# Patient Record
Sex: Female | Born: 1955 | Race: White | Hispanic: No | Marital: Married | Smoking: Never smoker
Health system: Southern US, Community
[De-identification: ages and names within clinical notes are randomized; demographics above are authoritative.]

## PROBLEM LIST (undated history)

## (undated) DIAGNOSIS — Z789 Other specified health status: Secondary | ICD-10-CM

## (undated) DIAGNOSIS — T8859XA Other complications of anesthesia, initial encounter: Secondary | ICD-10-CM

## (undated) DIAGNOSIS — Z9889 Other specified postprocedural states: Secondary | ICD-10-CM

## (undated) DIAGNOSIS — M1990 Unspecified osteoarthritis, unspecified site: Secondary | ICD-10-CM

## (undated) DIAGNOSIS — R112 Nausea with vomiting, unspecified: Secondary | ICD-10-CM

## (undated) DIAGNOSIS — T4145XA Adverse effect of unspecified anesthetic, initial encounter: Secondary | ICD-10-CM

## (undated) DIAGNOSIS — E039 Hypothyroidism, unspecified: Secondary | ICD-10-CM

## (undated) HISTORY — PX: COLONOSCOPY: SHX174

## (undated) HISTORY — PX: KNEE ARTHROSCOPY: SHX127

---

## 2008-07-08 ENCOUNTER — Encounter: Admission: RE | Admit: 2008-07-08 | Discharge: 2008-10-06 | Payer: Self-pay | Admitting: Orthopedic Surgery

## 2009-09-29 ENCOUNTER — Inpatient Hospital Stay (HOSPITAL_COMMUNITY): Admission: RE | Admit: 2009-09-29 | Discharge: 2009-10-01 | Payer: Self-pay | Admitting: Orthopedic Surgery

## 2009-09-29 HISTORY — PX: TOTAL KNEE ARTHROPLASTY: SHX125

## 2009-10-29 ENCOUNTER — Encounter: Admission: RE | Admit: 2009-10-29 | Discharge: 2009-10-29 | Payer: Self-pay | Admitting: Neurosurgery

## 2009-11-13 ENCOUNTER — Encounter
Admission: RE | Admit: 2009-11-13 | Discharge: 2010-01-27 | Payer: Self-pay | Source: Home / Self Care | Attending: Orthopedic Surgery | Admitting: Orthopedic Surgery

## 2010-04-17 LAB — CROSSMATCH

## 2010-04-17 LAB — CBC
Hemoglobin: 8.6 g/dL — ABNORMAL LOW (ref 12.0–15.0)
Hemoglobin: 9.5 g/dL — ABNORMAL LOW (ref 12.0–15.0)
MCHC: 33 g/dL (ref 30.0–36.0)
MCHC: 33.6 g/dL (ref 30.0–36.0)
MCHC: 34 g/dL (ref 30.0–36.0)
MCV: 92.2 fL (ref 78.0–100.0)
MCV: 94.5 fL (ref 78.0–100.0)
MCV: 95.3 fL (ref 78.0–100.0)
Platelets: 227 10*3/uL (ref 150–400)
RBC: 3.07 MIL/uL — ABNORMAL LOW (ref 3.87–5.11)
RDW: 12.8 % (ref 11.5–15.5)
WBC: 11.5 10*3/uL — ABNORMAL HIGH (ref 4.0–10.5)
WBC: 8.3 10*3/uL (ref 4.0–10.5)

## 2010-04-17 LAB — BASIC METABOLIC PANEL
Calcium: 8.5 mg/dL (ref 8.4–10.5)
Chloride: 106 mEq/L (ref 96–112)
Creatinine, Ser: 0.81 mg/dL (ref 0.4–1.2)
GFR calc non Af Amer: >60 mL/min (ref >60)
Glucose, Bld: 108 mg/dL — ABNORMAL HIGH (ref 70–99)
Potassium: 3.3 mEq/L — ABNORMAL LOW (ref 3.5–5.1)
Sodium: 139 mEq/L (ref 135–145)

## 2010-04-17 LAB — COMPREHENSIVE METABOLIC PANEL
ALT: 25 U/L (ref 0–35)
AST: 25 U/L (ref 0–37)
Albumin: 4.6 g/dL (ref 3.5–5.2)
Alkaline Phosphatase: 48 U/L (ref 39–117)
CO2: 29 mEq/L (ref 19–32)
GFR calc Af Amer: >60 mL/min (ref >60)
GFR calc non Af Amer: >60 mL/min (ref >60)
Glucose, Bld: 104 mg/dL — ABNORMAL HIGH (ref 70–99)
Potassium: 4 mEq/L (ref 3.5–5.1)
Sodium: 138 mEq/L (ref 135–145)
Total Bilirubin: 0.9 mg/dL (ref 0.3–1.2)
Total Protein: 7.1 g/dL (ref 6.0–8.3)

## 2010-04-17 LAB — URINALYSIS, ROUTINE W REFLEX MICROSCOPIC
Glucose, UA: NEGATIVE mg/dL
Hgb urine dipstick: NEGATIVE
Nitrite: NEGATIVE
pH: 6.5 (ref 5.0–8.0)

## 2010-04-17 LAB — DIFFERENTIAL
Basophils Absolute: 0 10*3/uL (ref 0.0–0.1)
Lymphocytes Relative: 39 % (ref 12–46)
Monocytes Relative: 7 % (ref 3–12)
Neutrophils Relative %: 52 % (ref 43–77)

## 2010-04-17 LAB — SURGICAL PCR SCREEN
MRSA, PCR: NEGATIVE
Staphylococcus aureus: NEGATIVE

## 2012-02-02 HISTORY — PX: LUMBAR FUSION: SHX111

## 2013-10-01 ENCOUNTER — Encounter (HOSPITAL_BASED_OUTPATIENT_CLINIC_OR_DEPARTMENT_OTHER): Payer: Self-pay | Admitting: *Deleted

## 2013-10-01 NOTE — Progress Notes (Signed)
No labs Pt insists she is doing this under local-told her to be here 1215-husband will come wih her

## 2013-10-02 ENCOUNTER — Other Ambulatory Visit: Payer: Self-pay | Admitting: Orthopedic Surgery

## 2013-10-04 ENCOUNTER — Encounter (HOSPITAL_BASED_OUTPATIENT_CLINIC_OR_DEPARTMENT_OTHER)
Admission: RE | Disposition: A | Discharge status: Home / Self Care | Payer: Self-pay | Source: Ambulatory Visit | Attending: Orthopedic Surgery

## 2013-10-04 ENCOUNTER — Encounter (HOSPITAL_BASED_OUTPATIENT_CLINIC_OR_DEPARTMENT_OTHER): Payer: Self-pay | Admitting: Anesthesiology

## 2013-10-04 ENCOUNTER — Ambulatory Visit (HOSPITAL_BASED_OUTPATIENT_CLINIC_OR_DEPARTMENT_OTHER)
Admission: RE | Admit: 2013-10-04 | Discharge: 2013-10-04 | Disposition: A | Discharge status: Home / Self Care | Payer: BC Managed Care – PPO | Source: Ambulatory Visit | Attending: Orthopedic Surgery | Admitting: Orthopedic Surgery

## 2013-10-04 ENCOUNTER — Encounter (HOSPITAL_BASED_OUTPATIENT_CLINIC_OR_DEPARTMENT_OTHER): Payer: Self-pay | Admitting: Orthopedic Surgery

## 2013-10-04 DIAGNOSIS — M19049 Primary osteoarthritis, unspecified hand: Secondary | ICD-10-CM | POA: Insufficient documentation

## 2013-10-04 DIAGNOSIS — M25849 Other specified joint disorders, unspecified hand: Secondary | ICD-10-CM | POA: Diagnosis not present

## 2013-10-04 DIAGNOSIS — E039 Hypothyroidism, unspecified: Secondary | ICD-10-CM | POA: Insufficient documentation

## 2013-10-04 DIAGNOSIS — E119 Type 2 diabetes mellitus without complications: Secondary | ICD-10-CM | POA: Insufficient documentation

## 2013-10-04 HISTORY — DX: Adverse effect of unspecified anesthetic, initial encounter: T41.45XA

## 2013-10-04 HISTORY — DX: Other specified postprocedural states: Z98.890

## 2013-10-04 HISTORY — PX: MASS EXCISION: SHX2000

## 2013-10-04 HISTORY — DX: Unspecified osteoarthritis, unspecified site: M19.90

## 2013-10-04 HISTORY — DX: Other specified health status: Z78.9

## 2013-10-04 HISTORY — DX: Nausea with vomiting, unspecified: R11.2

## 2013-10-04 HISTORY — DX: Other complications of anesthesia, initial encounter: T88.59XA

## 2013-10-04 HISTORY — DX: Hypothyroidism, unspecified: E03.9

## 2013-10-04 SURGERY — MINOR EXCISION OF MASS
Site: Finger | Laterality: Left

## 2013-10-04 MED ORDER — LIDOCAINE HCL (PF) 1 % IJ SOLN
INTRAMUSCULAR | Status: DC | PRN
  Administered 2013-10-04: 4 mL

## 2013-10-04 MED ORDER — MIDAZOLAM HCL 2 MG/2ML IJ SOLN
INTRAMUSCULAR | Status: AC
  Filled 2013-10-04: qty 2

## 2013-10-04 MED ORDER — BUPIVACAINE HCL (PF) 0.25 % IJ SOLN
INTRAMUSCULAR | Status: AC
  Filled 2013-10-04: qty 30

## 2013-10-04 MED ORDER — HYDROCODONE-ACETAMINOPHEN 5-325 MG PO TABS: 1.0000 | ORAL_TABLET | Freq: Four times a day (QID) | ORAL | Status: DC | PRN

## 2013-10-04 MED ORDER — SULFAMETHOXAZOLE-TMP DS 800-160 MG PO TABS: 1.0000 | ORAL_TABLET | Freq: Two times a day (BID) | ORAL | Status: DC

## 2013-10-04 MED ORDER — LACTATED RINGERS IV SOLN: INTRAVENOUS | Status: DC

## 2013-10-04 MED ORDER — FENTANYL CITRATE 0.05 MG/ML IJ SOLN
INTRAMUSCULAR | Status: AC
  Filled 2013-10-04: qty 4

## 2013-10-04 MED ORDER — VANCOMYCIN HCL IN DEXTROSE 1-5 GM/200ML-% IV SOLN: 1000.0000 mg | INTRAVENOUS | Status: DC

## 2013-10-04 MED ORDER — FENTANYL CITRATE 0.05 MG/ML IJ SOLN: 50.0000 ug | INTRAMUSCULAR | Status: DC | PRN

## 2013-10-04 MED ORDER — BUPIVACAINE HCL (PF) 0.25 % IJ SOLN
INTRAMUSCULAR | Status: DC | PRN
  Administered 2013-10-04: 4 mL

## 2013-10-04 MED ORDER — MIDAZOLAM HCL 2 MG/2ML IJ SOLN: 1.0000 mg | INTRAMUSCULAR | Status: DC | PRN

## 2013-10-04 MED ORDER — CHLORHEXIDINE GLUCONATE 4 % EX LIQD: 60.0000 mL | Freq: Once | CUTANEOUS | Status: DC

## 2013-10-04 MED ORDER — LIDOCAINE HCL (PF) 1 % IJ SOLN
INTRAMUSCULAR | Status: AC
  Filled 2013-10-04: qty 30

## 2013-10-04 SURGICAL SUPPLY — 53 items
BANDAGE COBAN STERILE 2 (GAUZE/BANDAGES/DRESSINGS) IMPLANT
BLADE MINI RND TIP GREEN BEAV (BLADE) IMPLANT
BLADE SURG 15 STRL LF DISP TIS (BLADE) ×1 IMPLANT
BLADE SURG 15 STRL SS (BLADE) ×2
BNDG COHESIVE 1X5 TAN STRL LF (GAUZE/BANDAGES/DRESSINGS) ×3 IMPLANT
BNDG COHESIVE 3X5 TAN STRL LF (GAUZE/BANDAGES/DRESSINGS) IMPLANT
BNDG ESMARK 4X9 LF (GAUZE/BANDAGES/DRESSINGS) IMPLANT
BNDG GAUZE ELAST 4 BULKY (GAUZE/BANDAGES/DRESSINGS) IMPLANT
CHLORAPREP W/TINT 26ML (MISCELLANEOUS) ×3 IMPLANT
CORDS BIPOLAR (ELECTRODE) IMPLANT
COVER MAYO STAND STRL (DRAPES) ×3 IMPLANT
COVER TABLE BACK 60X90 (DRAPES) ×3 IMPLANT
CUFF TOURNIQUET SINGLE 18IN (TOURNIQUET CUFF) IMPLANT
DECANTER SPIKE VIAL GLASS SM (MISCELLANEOUS) IMPLANT
DRAIN PENROSE 1/2X12 LTX STRL (WOUND CARE) ×3 IMPLANT
DRAPE EXTREMITY T 121X128X90 (DRAPE) ×3 IMPLANT
DRAPE SURG 17X23 STRL (DRAPES) ×3 IMPLANT
GAUZE SPONGE 4X4 12PLY STRL (GAUZE/BANDAGES/DRESSINGS) IMPLANT
GAUZE XEROFORM 1X8 LF (GAUZE/BANDAGES/DRESSINGS) ×3 IMPLANT
GLOVE BIOGEL PI IND STRL 7.0 (GLOVE) ×1 IMPLANT
GLOVE BIOGEL PI IND STRL 8.5 (GLOVE) ×1 IMPLANT
GLOVE BIOGEL PI INDICATOR 7.0 (GLOVE) ×2
GLOVE BIOGEL PI INDICATOR 8.5 (GLOVE) ×2
GLOVE ECLIPSE 6.5 STRL STRAW (GLOVE) ×3 IMPLANT
GLOVE EXAM NITRILE LRG STRL (GLOVE) ×3 IMPLANT
GLOVE SURG ORTHO 8.0 STRL STRW (GLOVE) ×3 IMPLANT
GOWN STRL REUS W/ TWL LRG LVL3 (GOWN DISPOSABLE) ×1 IMPLANT
GOWN STRL REUS W/TWL LRG LVL3 (GOWN DISPOSABLE) ×2
GOWN STRL REUS W/TWL XL LVL3 (GOWN DISPOSABLE) ×3 IMPLANT
NDL SAFETY ECLIPSE 18X1.5 (NEEDLE) IMPLANT
NEEDLE 27GAX1X1/2 (NEEDLE) ×3 IMPLANT
NEEDLE HYPO 18GX1.5 SHARP (NEEDLE)
NS IRRIG 1000ML POUR BTL (IV SOLUTION) ×3 IMPLANT
PACK BASIN DAY SURGERY FS (CUSTOM PROCEDURE TRAY) ×3 IMPLANT
PAD CAST 3X4 CTTN HI CHSV (CAST SUPPLIES) IMPLANT
PADDING CAST ABS 3INX4YD NS (CAST SUPPLIES)
PADDING CAST ABS 4INX4YD NS (CAST SUPPLIES)
PADDING CAST ABS COTTON 3X4 (CAST SUPPLIES) IMPLANT
PADDING CAST ABS COTTON 4X4 ST (CAST SUPPLIES) IMPLANT
PADDING CAST COTTON 3X4 STRL (CAST SUPPLIES)
SPLINT FINGER 5/8X3.25 (SOFTGOODS) ×1 IMPLANT
SPLINT FINGER FOAM 3 9119 05 (SOFTGOODS) ×3
SPLINT PLASTER CAST XFAST 3X15 (CAST SUPPLIES) IMPLANT
SPLINT PLASTER XTRA FASTSET 3X (CAST SUPPLIES)
SPONGE GAUZE 4X4 12PLY STER LF (GAUZE/BANDAGES/DRESSINGS) ×3 IMPLANT
STOCKINETTE 4X48 STRL (DRAPES) ×3 IMPLANT
SUT VIC AB 4-0 P2 18 (SUTURE) IMPLANT
SUT VICRYL RAPID 5 0 P 3 (SUTURE) IMPLANT
SUT VICRYL RAPIDE 4/0 PS 2 (SUTURE) ×3 IMPLANT
SYR BULB 3OZ (MISCELLANEOUS) ×3 IMPLANT
SYR CONTROL 10ML LL (SYRINGE) ×3 IMPLANT
TOWEL OR 17X24 6PK STRL BLUE (TOWEL DISPOSABLE) ×3 IMPLANT
UNDERPAD 30X30 INCONTINENT (UNDERPADS AND DIAPERS) IMPLANT

## 2013-10-04 NOTE — H&P (Signed)
Cassandra Hoffman is a 58 year old female complaining of a mass on the left ring finger DIP joint for the past 3-4 years, bilateral discomfort at the [facility] joint of her thumb. She is right handed. She relates no history of injury. She does have a history of diabetes, thyroid problems and arthritis. She has no history of gout. Her family history is positive for diabetes, thyroid problems and arthritis. She complains of intermittent mild discomfort with burning pain and a feeling of swelling. She is not complaining of any numbness or tingling. She states it is getting worse. Activity makes it worse. The mass on her left ring finger DIP joint has enlarged. She has considered draining it.  PAST MEDICAL HISTORY: She is allergic to PCN. She is on thyroid replacement, vitamin D. She has had right knee surgery replacement done by Dr. Sherlean Foot and back surgery.  FAMILY H ISTORY: Positive for diabetes, heart disease and arthritis.  SOCIAL HISTORY: She does not smoke or drink. She is married and works in Airline pilot at [employer].  REVIEW OF SYSTEMS: Positive for glasses, otherwise negative for 14 points. Cassandra Hoffman is an 58 y.o. female.   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 Medical history non-contributory   . Arthritis   . Hypothyroidism     Past Surgical History  Procedure Laterality Date  . Lumbar fusion  2014  . Total knee arthroplasty  2011    right  . Knee arthroscopy      right x2  . Cesarean section    . Colonoscopy      History reviewed. No pertinent family history. Social History:  reports that she has never smoked. She does not have any smokeless tobacco history on file. She reports that she does not drink alcohol or use illicit drugs.  Allergies:  Allergies  Allergen Reactions  . Asa [Aspirin]     Problems voiding  . Penicillins Rash    No prescriptions prior to admission    No results  found for this or any previous visit (from the past 48 hour(s)).  No results found.   Pertinent items are noted in HPI.  Height 5' 8.5" (1.74 m), weight 83.915 kg (185 lb).  General appearance: alert, cooperative and appears stated age Head: Normocephalic, without obvious abnormality Neck: no JVD Resp: clear to auscultation bilaterally Cardio: regular rate and rhythm, S1, S2 normal, no murmur, click, rub or gallop GI: soft, non-tender; bowel sounds normal; no masses,  no organomegaly Extremities: mucoid cyst and arthritis left ring finger Pulses: 2+ and symmetric Skin: Skin color, texture, turgor normal. No rashes or lesions Neurologic: Grossly normal Incision/Wound: na  Assessment/Plan X-rays reveal degenerative changes at the [facility] joints of the thumbs bilaterally.  X-rays of her fingers reveal degenerative changes at the IP joint of her thumb, DIP joints of her fingers. There is no degenerative arthritis. Mucoid tumor left ring finger.  We have discussed the possibility of surgical debridement of the joint along with excision of the mucoid cyst. She is advised not to open this. She is not complaining of any significant discomfort at the [facility] joints of her thumbs. As such we would not recommend any treatment at the present time. The pre, peri and post op course are discussed along with risks and complications.  She is aware there is no guarantee with surgery, possibility of infection, recurrence, injury to arteries, nerves and tendons,  incomplete relief of symptoms and dystrophy.  She is scheduled for excision mucoid cyst, debridement DIP joint left ring finger as an outpatient under regional anesthesia  Tania Perrott R 10/04/2013, 9:06 AM

## 2013-10-04 NOTE — Op Note (Signed)
Dictation Number [id number]

## 2013-10-04 NOTE — Op Note (Signed)
NAMEEBBA, GOLL NO.:  [id number]  MEDICAL RECORD NO.:  [id number]  LOCATION:                                 FACILITY:  PHYSICIAN:  Cindee Salt, M.D.            DATE OF BIRTH:  DATE OF PROCEDURE:  10/04/2013 DATE OF DISCHARGE:                              OPERATIVE REPORT   PREOPERATIVE DIAGNOSIS:  Mucoid cyst, arthritis, distal interphalangeal joint, left ring finger.  POSTOPERATIVE DIAGNOSIS:  Mucoid cyst, arthritis, distal interphalangeal joint, left ring finger.  OPERATION:  Excision of mucoid tumor with debridement distal interphalangeal joint, left ring finger.  SURGEON:  Cindee Salt, MD.  ANESTHESIA:  Metacarpal block.  HISTORY:  The patient is a 58 year old female with a history of a mass on the dorsal radial aspect of the DIP joint of her left ring finger. This does transilluminate.  X-rays revealed degenerative changes in the distal interphalangeal joint.  He elected to have this surgically excised.  She is aware that there is no guarantee with the surgery, possibility of infection; recurrence of injury to arteries, nerves, tendons, incomplete relief of symptoms, and dystrophy.  DESCRIPTION OF PROCEDURE:  In the preoperative area, the patient was seen, the extremity marked by both patient and surgeon.  No antibiotic was given.  She was brought to the operating room, where a metacarpal block was given with 0.25% bupivacaine and 1% Xylocaine, both without epinephrine approximately 8 mL was used.  She was prepped using ChloraPrep, supine position with left arm free.  A 3-minute dry time was allowed.  Time-out taken, confirming the patient and procedure.  The digit was exsanguinated with a moistened gauze sponge.  A Penrose drain was used for tourniquet control at the base of the finger.  A curvilinear incision was made over the distal interphalangeal joint, carried down through subcutaneous tissue.  The cyst was immediately encountered with  blunt and sharp dissection, this was dissected free. The joint opened.  A synovectomy and debridement was then performed with a hemostat type rongeur.  The wound was copiously irrigated with saline. The specimen was sent to Pathology.  The wound was then closed with interrupted 4-0 Vicryl Rapide sutures.  A sterile compressive dressing to the finger with a dorsal splint was applied.  The Penrose removed. She was taken to the recovery room for observation in satisfactory condition.  She will be discharged home to return to [facility] in 1 week on [place].          ______________________________ Cindee Salt, M.D.    GK/MEDQ  D:  10/04/2013  T:  10/04/2013  Job:  [id number]

## 2013-10-04 NOTE — Discharge Instructions (Signed)

## 2013-10-04 NOTE — Brief Op Note (Signed)
10/04/2013  1:56 PM  PATIENT:  Cassandra Hoffman  58 y.o. female  PRE-OPERATIVE DIAGNOSIS:  mucoid tumor left ring finger with degenrative arthritis distal interphalangeal joint  POST-OPERATIVE DIAGNOSIS:  * No post-op diagnosis entered *  PROCEDURE:  Procedure(s): MINOR EXCISION OF MASS OF DISTAL INTERPHALANGEAL JOINT LEFT RING FINGER (Left)  SURGEON:  Surgeon(s) and Role:    * Cindee Salt, MD - Primary  PHYSICIAN ASSISTANT:   ASSISTANTS: none   ANESTHESIA:   local  EBL:     BLOOD ADMINISTERED:none  DRAINS: none   LOCAL MEDICATIONS USED:  BUPIVICAINE  and XYLOCAINE   SPECIMEN:  Excision  DISPOSITION OF SPECIMEN:  PATHOLOGY  COUNTS:  YES  TOURNIQUET:   Total Tourniquet Time Documented: area (laterality) - 15 minutes Total: area (laterality) - 15 minutes   DICTATION: .Other Dictation: Dictation Number [id number]  PLAN OF CARE: Discharge to home after PACU  PATIENT DISPOSITION:  PACU - hemodynamically stable.

## 2013-10-05 ENCOUNTER — Encounter (HOSPITAL_BASED_OUTPATIENT_CLINIC_OR_DEPARTMENT_OTHER): Payer: Self-pay | Admitting: Orthopedic Surgery

## 2013-11-13 ENCOUNTER — Other Ambulatory Visit: Payer: Self-pay | Admitting: Orthopedic Surgery

## 2013-11-16 ENCOUNTER — Encounter (HOSPITAL_BASED_OUTPATIENT_CLINIC_OR_DEPARTMENT_OTHER): Payer: Self-pay | Admitting: *Deleted

## 2013-11-22 ENCOUNTER — Ambulatory Visit (HOSPITAL_BASED_OUTPATIENT_CLINIC_OR_DEPARTMENT_OTHER)
Admission: RE | Admit: 2013-11-22 | Payer: BC Managed Care – PPO | Source: Ambulatory Visit | Admitting: Orthopedic Surgery

## 2013-11-22 ENCOUNTER — Encounter (HOSPITAL_BASED_OUTPATIENT_CLINIC_OR_DEPARTMENT_OTHER): Admission: RE | Payer: Self-pay | Source: Ambulatory Visit

## 2013-11-22 SURGERY — EXCISION MASS
Anesthesia: LOCAL | Site: Thumb | Laterality: Right

## 2017-02-26 ENCOUNTER — Other Ambulatory Visit: Payer: Self-pay | Admitting: Orthopedic Surgery

## 2017-02-26 DIAGNOSIS — R52 Pain, unspecified: Secondary | ICD-10-CM

## 2017-02-26 DIAGNOSIS — R531 Weakness: Secondary | ICD-10-CM

## 2017-03-03 ENCOUNTER — Ambulatory Visit
Admission: RE | Admit: 2017-03-03 | Discharge: 2017-03-03 | Disposition: A | Discharge status: Home / Self Care | Payer: BLUE CROSS/BLUE SHIELD | Source: Ambulatory Visit | Attending: Orthopedic Surgery | Admitting: Orthopedic Surgery

## 2017-03-03 DIAGNOSIS — R531 Weakness: Secondary | ICD-10-CM

## 2017-03-03 DIAGNOSIS — R52 Pain, unspecified: Secondary | ICD-10-CM

## 2018-11-29 ENCOUNTER — Ambulatory Visit: Payer: BC Managed Care – PPO | Attending: Neurological Surgery | Admitting: Physical Therapy

## 2018-11-29 ENCOUNTER — Encounter: Payer: Self-pay | Admitting: Physical Therapy

## 2018-11-29 ENCOUNTER — Other Ambulatory Visit: Payer: Self-pay

## 2018-11-29 DIAGNOSIS — G8929 Other chronic pain: Secondary | ICD-10-CM | POA: Insufficient documentation

## 2018-11-29 DIAGNOSIS — R262 Difficulty in walking, not elsewhere classified: Secondary | ICD-10-CM | POA: Insufficient documentation

## 2018-11-29 DIAGNOSIS — M5442 Lumbago with sciatica, left side: Secondary | ICD-10-CM | POA: Diagnosis present

## 2018-11-29 NOTE — Therapy (Addendum)
[facility] [address]  [place], [place], [address] Phone: [phone]   Fax:  [phone]  Physical Therapy Evaluation  Patient Details  Name: Cassandra Hoffman MRN: [id number] Date of Birth: [birth year] Referring Provider (PT): Sherley Bounds, MD   Encounter Date: 11/29/2018  PT End of Session - 11/29/18 1104    Visit Number  1    Number of Visits  7    Date for PT Re-Evaluation  01/10/19    Authorization Type  BCBS- VL: 7    Authorization - Visit Number  6    Authorization - Number of Visits  20    PT Start Time  4008    PT Stop Time  1057    PT Time Calculation (min)  42 min    Activity Tolerance  Patient tolerated treatment well    Behavior During Therapy  [facility] for tasks assessed/performed       Past Medical History:  Diagnosis Date  . Arthritis   . Complication of anesthesia   . Hypothyroidism   . Medical history non-contributory   . PONV (postoperative nausea and vomiting)     Past Surgical History:  Procedure Laterality Date  . CESAREAN SECTION    . COLONOSCOPY    . KNEE ARTHROSCOPY     right x2  . LUMBAR FUSION  2014  . MASS EXCISION Left 10/04/2013   Procedure: MINOR EXCISION OF MASS OF DISTAL INTERPHALANGEAL JOINT LEFT RING FINGER;  Surgeon: Daryll Brod, MD;  Location: [place];  Service: Orthopedics;  Laterality: Left;  . TOTAL KNEE ARTHROPLASTY Right 09/29/2009    There were no vitals filed for this visit.   Subjective Assessment - 11/29/18 1018    Subjective  Patient reports undergoing L3-4 lumbar decompression on 10/16/18, but not quite sure about specific level of the spine that procedure was on. Notes that she is still having pain from L PSIS radiating down into L buttock and posterior thigh. Posterior thigh pain feels like a stabbing knife and occurs when walking or sitting- generally this pain is spontaneous. Feels wobbly and weak when standing up, sometimes feeling like  she has to catch her balance. Also noting N/T along L lateral thigh 1x/week. Denies B&B changes. Pain now is similar to before surgery, but much improved. Would like to return to walking 1 mile at a fast pace daily.    Pertinent History  hypothyroidism, R TKA 2011, R knee arthroscopy x2, L5-S1 fusion 2014    Limitations  Sitting;Lifting;Standing;Walking;House hold activities    How long can you sit comfortably?  unlimited    How long can you stand comfortably?  unlimited    How long can you walk comfortably?  nearly a mile    Diagnostic tests  none recent    Patient Stated Goals  get back to walking a mile daily    Currently in Pain?  Yes    Pain Score  0-No pain    Pain Location  Leg   hamstring   Pain Orientation  Left;Posterior    Pain Descriptors / Indicators  Shooting;Stabbing    Pain Type  Acute pain         [facility] PT Assessment - 11/29/18 1030      Assessment   Medical Diagnosis  Lumbar radicular pain    Referring Provider (PT)  Sherley Bounds, MD    Onset Date/Surgical Date  10/16/18    Next MD Visit  not  scheduled    Prior Therapy  yes- for TKA      Precautions   Precautions  --   no lifting >20lbs     Balance Screen   Has the patient fallen in the past 6 months  No    Has the patient had a decrease in activity level because of a fear of falling?   No    Is the patient reluctant to leave their home because of a fear of falling?   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     Leisure  walking      Cognition   Overall  Cognitive Status  Within Functional Limits for tasks assessed      Observation/Other Assessments   Observations  2 well-healed incisions over lumbar spine    Focus on Therapeutic Outcomes (FOTO)   lumbar: 67 (33% limited, 28% predicted)      Sensation   Light Touch  Appears Intact      Coordination   Gross Motor Movements are Fluid and Coordinated  Yes      Posture/Postural Control   Posture/Postural Control  Postural limitations    Postural Limitations  Weight shift right;Rounded Shoulders;Forward head      ROM / Strength   AROM / PROM / Strength  AROM;Strength      AROM   AROM Assessment Site  Lumbar    Lumbar Flexion  toes    Lumbar Extension  mildly limited    Lumbar - Right Side Bend  distal thigh    Lumbar - Left Side Bend  distal thigh   1/10 pain in LB   Lumbar - Right Rotation  WNL    Lumbar - Left Rotation  WNL      Strength   Strength Assessment Site  Hip;Knee;Ankle    Right/Left Hip  Right;Left    Right Hip Flexion  4/5    Right Hip ABduction  4+/5    Right Hip ADduction  4+/5    Left Hip Flexion  4+/5    Left Hip ABduction  4+/5    Left Hip ADduction  4/5    Right/Left Knee  Right;Left    Right Knee Flexion  4+/5    Right Knee Extension  4+/5    Left Knee Flexion  4-/5    Left Knee Extension  4+/5    Right/Left Ankle  Right;Left    Right Ankle Dorsiflexion  5/5    Right Ankle Plantar Flexion  4+/5    Left Ankle Dorsiflexion  5/5    Left Ankle Plantar Flexion  4+/5      Flexibility   Soft Tissue Assessment /Muscle Length  yes    Hamstrings  mildly tight B LE    Piriformis  mildly tight B LE      Palpation   Palpation comment  TTP over L  PSIS, medial proximal glute, L TFL; increased soft tissue retstriction in B lumbar paraspinals      Special Tests    Special Tests  Lumbar    Lumbar Tests  Straight Leg Raise      Straight Leg Raise   Findings  Negative    Comment  negative B sides      Ambulation/Gait   Assistive device  None    Gait  Pattern  Step-to pattern;Step-through pattern;Decreased step length - right;Decreased stance time - left;Decreased weight shift to left;Lateral trunk lean to right;Lateral trunk lean to left;Trunk flexed;Antalgic    Ambulation Surface  Level;Indoor    Gait velocity  WFL                Objective measurements completed on examination: See above findings.              PT Education - 11/29/18 1104    Education Details  prognosis, POC, HEP    Person(s) Educated  Patient    Methods  Explanation;Demonstration;Tactile cues;Verbal cues;Handout    Comprehension  Verbalized understanding;Returned demonstration       PT Short Term Goals - 11/29/18 1113      PT SHORT TERM GOAL #1   Title  Patient to be independent with initial HEP.    Time  3    Period  Weeks    Status  New    Target Date  12/20/18        PT Long Term Goals - 11/29/18 1113      PT LONG TERM GOAL #1   Title  Patient to be independent wiht advanced HEP.    Time  6    Period  Weeks    Status  New    Target Date  01/10/19      PT LONG TERM GOAL #2   Title  Patient to demonstrate B LE strength >/=4+/5.    Time  6    Period  Weeks    Status  New    Target Date  01/10/19      PT LONG TERM GOAL #3   Title  Patient to report tolerance for walking 1 mile 5x/week without limitations.    Time  6    Period  Weeks    Status  New    Target Date  01/10/19      PT LONG TERM GOAL #4   Title  Patient to report 75% improvement in pain levels.    Time  6    Period  Weeks    Status  New    Target Date  01/10/19             Plan - 11/29/18 1105    Clinical Impression Statement  Patient is a 63y/o F presenting to OPPT with c/o L sided LBP radiating into L buttock and HS since L3-4 decompression on 10/16/18. Patient notes that the nature of the pain is similar to before surgery, but much improved. Posterior thigh pain feels like a "stabbing knife" and occurs when walking or sitting- generally this  pain is spontaneous. Feels wobbly and weak when standing up, sometimes feeling like she has to catch her balance. Also noting N/T along L lateral thigh 1x/week. Denies B&B changes. Patient would like to return to walking a mile/day. Also requires prolonged sitting and standing at work. Patient today presenting with good lumbar AROM, decreased L LE strength, TTP over L PSIS, glute, and TFL, increased tone in B lumbar  paraspinals, and gait deviations. Educated patient on gentle stretching and strengthening HEP- patient reported understanding. Would benefit from skilled PT services 1x/week for 6 weeks to address aforementioned impairments.    Personal Factors and Comorbidities  Age;Comorbidity 3+;Time since onset of injury/illness/exacerbation;Fitness;Past/Current Experience;Profession    Comorbidities  hypothyroidism, R TKA 2011, R knee arthroscopy x2, L5-S1 fusion 2014    Examination-Activity Limitations  Sit;Sleep;Bend;Squat;Stairs;Carry;Stand;Toileting;Dressing;Transfers;Hygiene/Grooming;Lift;Locomotion Level    Examination-Participation Restrictions  Church;Cleaning;Shop;Community Activity;Driving;Volunteer;Yard Work;Interpersonal Relationship;Laundry;Meal Prep    Stability/Clinical Decision Making  Stable/Uncomplicated    Clinical Decision Making  Low    Rehab Potential  Good    PT Frequency  1x / week    PT Duration  6 weeks    PT Treatment/Interventions  ADLs/Self Care Home Management;Cryotherapy;Electrical Stimulation;Moist Heat;Balance training;Therapeutic exercise;Therapeutic activities;Functional mobility training;Stair training;Gait training;DME Instruction;Ultrasound;Neuromuscular re-education;Patient/family education;Manual techniques;Taping;Energy conservation;Dry needling;Passive range of motion;Scar mobilization    PT Next Visit Plan  reassess HEP    Consulted and Agree with Plan of Care  Patient       Patient will benefit from skilled therapeutic intervention in order to improve the  following deficits and impairments:  Decreased activity tolerance, Decreased strength, Pain, Increased fascial restricitons, Difficulty walking, Increased muscle spasms, Improper body mechanics, Decreased range of motion, Postural dysfunction, Impaired flexibility  Visit Diagnosis: Chronic left-sided low back pain with left-sided sciatica  Difficulty in walking, not elsewhere classified     Problem List There are no active problems to display for this patient.    Janene Harvey, PT, DPT 11/29/18 11:16 AM   [facility] [address]  [place], [place], [address] Phone: [phone]   Fax:  [phone]  Name: Cassandra Hoffman MRN: [id number] Date of Birth: 03/29/1955    PHYSICAL THERAPY DISCHARGE SUMMARY  Visits from Start of Care: 1  Current functional level related to goals / functional outcomes: See above clinical impression; patient did not return after initial eval   Remaining deficits: See above   Education / Equipment: HEP  Plan: Patient agrees to discharge.  Patient goals were not met. Patient is being discharged due to not returning since the last visit.  ?????     Janene Harvey, PT, DPT 01/04/19 11:16 AM

## 2018-12-06 ENCOUNTER — Ambulatory Visit: Payer: BC Managed Care – PPO

## 2019-05-17 ENCOUNTER — Other Ambulatory Visit: Payer: Self-pay | Admitting: Student

## 2019-05-17 DIAGNOSIS — M5416 Radiculopathy, lumbar region: Secondary | ICD-10-CM

## 2019-05-23 ENCOUNTER — Other Ambulatory Visit: Payer: Self-pay | Admitting: Student

## 2019-05-23 ENCOUNTER — Ambulatory Visit
Admission: RE | Admit: 2019-05-23 | Discharge: 2019-05-23 | Disposition: A | Discharge status: Home / Self Care | Payer: BC Managed Care – PPO | Source: Ambulatory Visit | Attending: Student | Admitting: Student

## 2019-05-23 ENCOUNTER — Other Ambulatory Visit: Payer: Self-pay

## 2019-05-23 DIAGNOSIS — M5416 Radiculopathy, lumbar region: Secondary | ICD-10-CM

## 2019-05-23 MED ORDER — IOPAMIDOL (ISOVUE-M 200) INJECTION 41%: 1.0000 mL | Freq: Once | INTRAMUSCULAR | Status: DC

## 2019-05-23 NOTE — Discharge Instructions (Signed)

## 2019-05-24 ENCOUNTER — Other Ambulatory Visit: Payer: Self-pay | Admitting: Student

## 2019-05-24 DIAGNOSIS — M5416 Radiculopathy, lumbar region: Secondary | ICD-10-CM

## 2019-06-06 ENCOUNTER — Other Ambulatory Visit: Payer: Self-pay

## 2019-06-06 ENCOUNTER — Ambulatory Visit
Admission: RE | Admit: 2019-06-06 | Discharge: 2019-06-06 | Disposition: A | Discharge status: Home / Self Care | Payer: BC Managed Care – PPO | Source: Ambulatory Visit | Attending: Student | Admitting: Student

## 2019-06-06 DIAGNOSIS — M5416 Radiculopathy, lumbar region: Secondary | ICD-10-CM

## 2019-06-06 MED ORDER — IOPAMIDOL (ISOVUE-370) INJECTION 76%: 60.0000 mL | Freq: Once | INTRAVENOUS | Status: DC | PRN

## 2021-12-30 IMAGING — CT CT L SPINE W/O CM
1 of 6 series · 6 of 14 positions shown, 8 images · non-contrast
Comparison: Lumbar myelogram 10/29/2009

CLINICAL DATA: Lumbar radicular pain, side not specified

EXAM:
CT LUMBAR SPINE WITHOUT CONTRAST
TECHNIQUE: Multidetector CT imaging of the lumbar spine was performed without
intravenous contrast administration. Multiplanar CT image
reconstructions were also generated.

[Series 2: l spine soft (person_name) · axial · 0.33mm/px · z∈[-298,-124]mm · 6 of 82 slices shown, 8 images]
[im 12/82  soft-tissue]
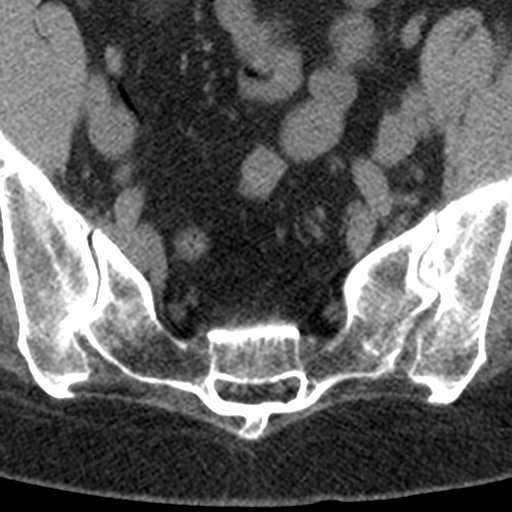
[im 12/82  bone]
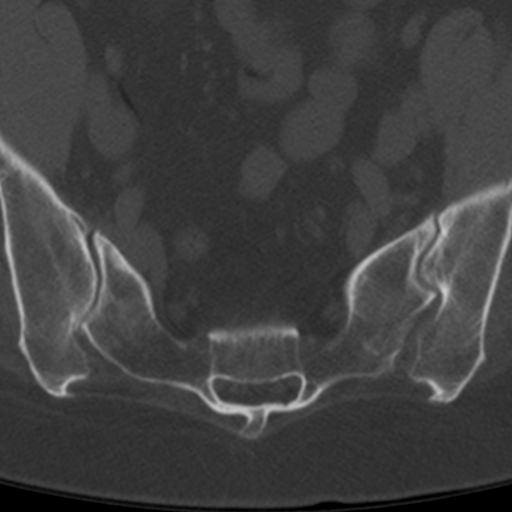
[im 24/82  bone]
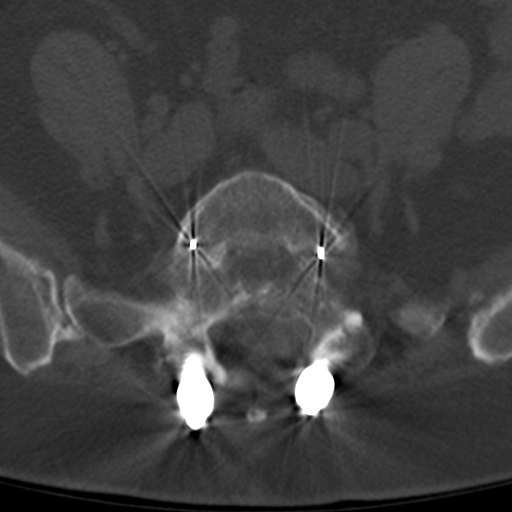
[im 35/82  bone]
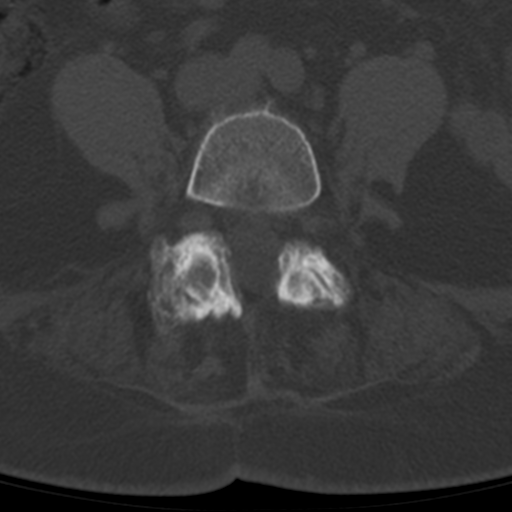
[im 47/82  bone]
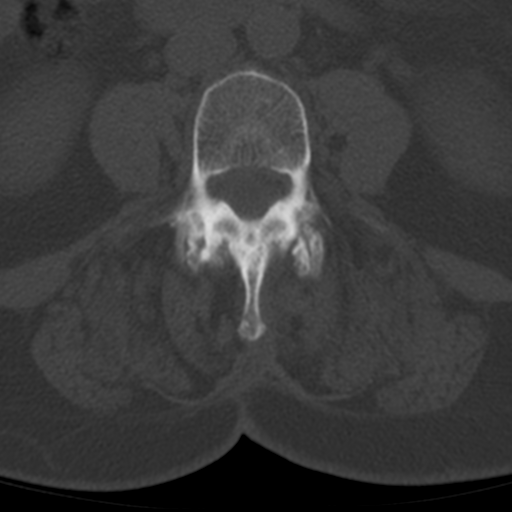
[im 58/82  soft-tissue]
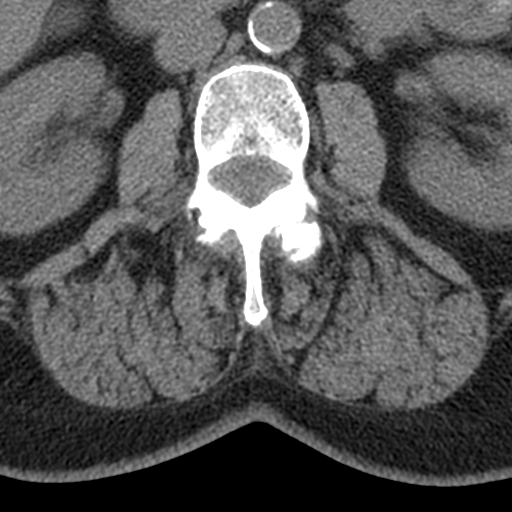
[im 58/82  bone]
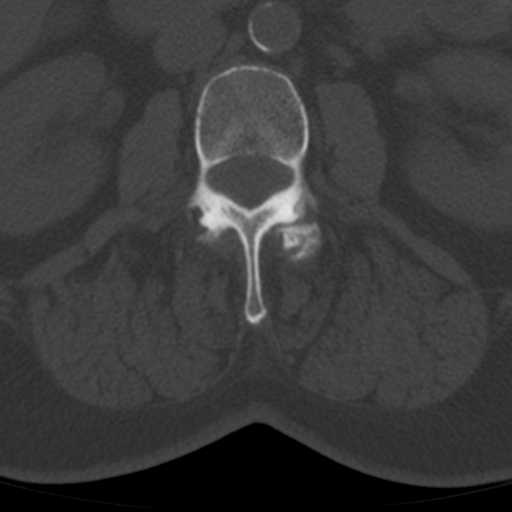
[im 70/82  bone]
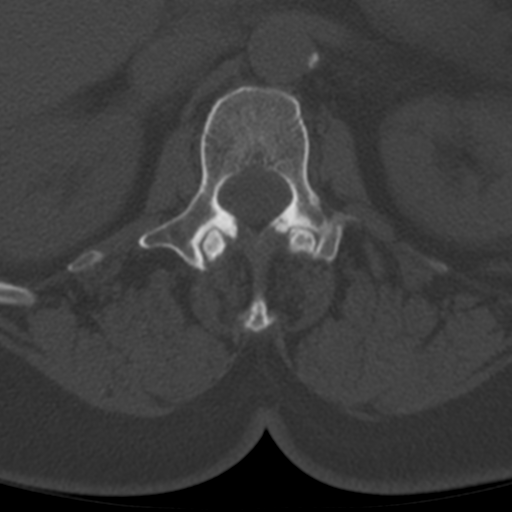

[6 of 14 positions shown; findings below may reference images not displayed]

FINDINGS: Segmentation: 5 lumbar type vertebrae

Alignment: Grade 1 anterolisthesis at L3-4 to L5-S1.

Vertebrae: L5-S1 PLIF since prior. There is solid arthrodesis. Facet
ankylosis or fusion has occurred at L4-5. No acute fracture,
discitis, or aggressive bone lesion

Paraspinal and other soft tissues: Diffuse atheromatous plaque of
the aorta.

Disc levels:

T12- L1: Mild facet spurring

L1-L2: Moderate degenerative facet spurring and mild disc narrowing.
The foramina are patent

L2-L3: Facet osteoarthritis with bulky and progressive hypertrophy.
There is ligamentum flavum thickening and disc bulging. Moderate
spinal stenosis. Patent foramina

L3-L4: Facet osteoarthritis with bulky spurring and mild
anterolisthesis. Mild disc bulging. There has been a laminotomy with
patent spinal canal. Patent foramina

L4-L5: Ankylosis or fusion on the right. There has been laminectomy
with patent canal and foramina

L5-S1:PLIF with solid arthrodesis. Slip and spurring causes
foraminal distortion but no compressive stenosis suspected.
IMPRESSION: 1. Advanced and diffuse facet arthropathy with multilevel listhesis.
At the non fused levels there is progression since [DATE]. L2-3 moderate spinal stenosis primarily from posterior element
hypertrophy.
3. Patent canal after decompression at L3-4 to L5-S1.
4. L5-S1 PLIF with solid arthrodesis. There is a ankylosis or solid
posterior-lateral arthrodesis also at L4-5.

## 2022-11-12 ENCOUNTER — Emergency Department (HOSPITAL_BASED_OUTPATIENT_CLINIC_OR_DEPARTMENT_OTHER): Payer: BC Managed Care – PPO

## 2022-11-12 ENCOUNTER — Other Ambulatory Visit: Payer: Self-pay

## 2022-11-12 ENCOUNTER — Emergency Department (HOSPITAL_BASED_OUTPATIENT_CLINIC_OR_DEPARTMENT_OTHER)
Admission: EM | Admit: 2022-11-12 | Discharge: 2022-11-12 | Disposition: A | Discharge status: Home / Self Care | Payer: BC Managed Care – PPO | Attending: Emergency Medicine | Admitting: Emergency Medicine

## 2022-11-12 ENCOUNTER — Encounter (HOSPITAL_BASED_OUTPATIENT_CLINIC_OR_DEPARTMENT_OTHER): Payer: Self-pay | Admitting: Pediatrics

## 2022-11-12 DIAGNOSIS — R9431 Abnormal electrocardiogram [ECG] [EKG]: Secondary | ICD-10-CM | POA: Diagnosis present

## 2022-11-12 DIAGNOSIS — E039 Hypothyroidism, unspecified: Secondary | ICD-10-CM | POA: Diagnosis not present

## 2022-11-12 DIAGNOSIS — E119 Type 2 diabetes mellitus without complications: Secondary | ICD-10-CM | POA: Insufficient documentation

## 2022-11-12 LAB — BASIC METABOLIC PANEL
Anion gap: 11 (ref 5–15)
BUN: 10 mg/dL (ref 8–23)
CO2: 27 mmol/L (ref 22–32)
Calcium: 9.4 mg/dL (ref 8.9–10.3)
Chloride: 101 mmol/L (ref 98–111)
Creatinine, Ser: 0.9 mg/dL (ref 0.44–1.00)
GFR, Estimated: >60 mL/min (ref >60)
Glucose, Bld: 127 mg/dL — ABNORMAL HIGH (ref 70–99)
Potassium: 3.7 mmol/L (ref 3.5–5.1)
Sodium: 139 mmol/L (ref 135–145)

## 2022-11-12 LAB — CBC WITH DIFFERENTIAL/PLATELET
Abs Immature Granulocytes: 0.01 10*3/uL (ref 0.00–0.07)
Basophils Absolute: 0 10*3/uL (ref 0.0–0.1)
Basophils Relative: 1 %
Eosinophils Absolute: 0.1 10*3/uL (ref 0.0–0.5)
Eosinophils Relative: 2 %
HCT: 42 % (ref 36.0–46.0)
Hemoglobin: 14 g/dL (ref 12.0–15.0)
Immature Granulocytes: 0 %
Lymphocytes Relative: 40 %
Lymphs Abs: 2.5 10*3/uL (ref 0.7–4.0)
MCH: 31 pg (ref 26.0–34.0)
MCHC: 33.3 g/dL (ref 30.0–36.0)
MCV: 93.1 fL (ref 80.0–100.0)
Monocytes Absolute: 0.4 10*3/uL (ref 0.1–1.0)
Monocytes Relative: 7 %
Neutro Abs: 3.3 10*3/uL (ref 1.7–7.7)
Neutrophils Relative %: 50 %
Platelets: 260 10*3/uL (ref 150–400)
RBC: 4.51 MIL/uL (ref 3.87–5.11)
RDW: 12.7 % (ref 11.5–15.5)
WBC: 6.4 10*3/uL (ref 4.0–10.5)
nRBC: 0 % (ref 0.0–0.2)

## 2022-11-12 LAB — TROPONIN I (HIGH SENSITIVITY): Troponin I (High Sensitivity): <2 ng/L (ref <18)

## 2022-11-12 LAB — MAGNESIUM: Magnesium: 2 mg/dL (ref 1.7–2.4)

## 2022-11-12 NOTE — ED Provider Notes (Signed)
[place] [facility] AT [facility] Provider Note   CSN: [id number] Arrival date & time: 11/12/22  1130     History  Chief Complaint  Patient presents with   Abnormal ECG    Cassandra Hoffman is a 67 y.o. female with past medical history significant for hypothyroidism, DM, mixed hyperlipidemia, and carotid stenosis who presents to the ED from GI due to 1 run of nonsustained V. tach while awaiting for endoscopy and colonoscopy just prior to arrival.  Patient denies any chest pain or palpitations during V. tach.  Reviewed EKGs from GI.  Patient has multiple PVCs.  Had 1 run of nonsustained V. Tach (3 PVCs).  Patient denies shortness of breath, chest pain, nausea, vomiting, and diaphoresis.  Patient is followed by Ansel Bong, PA-C with [facility] cardiology. No previous abnormal arrhythmias.  Patient was in her normal state of health today.  Did finish bowel cleanout early this morning in anticipation for her endoscopy and colonoscopy today. No fever or chills.   History obtained from patient and past medical records. No interpreter used during encounter.       Home Medications Prior to Admission medications   Medication Sig Start Date End Date Taking? Authorizing Provider  Cholecalciferol 125 MCG (5000 UT) TABS Take by mouth.    [provider]  clobetasol cream (TEMOVATE) 0.05 % Apply thin amount daily prn allergic dermatitis 09/27/18   [provider]  methocarbamol (ROBAXIN) 500 MG tablet  03/08/19   [provider]  Multiple Vitamin (MULTI-VITAMIN) tablet Take by mouth.    [provider]  Potassium Gluconate 2.5 MEQ TABS Take by mouth.    [provider]      Allergies    Asa [aspirin] and Penicillins    Review of Systems   Review of Systems  Constitutional:  Negative for chills and fever.  Respiratory:  Negative for shortness of breath.   Cardiovascular:  Negative for chest pain.  Gastrointestinal:  Negative  for abdominal pain.    Physical Exam Updated Vital Signs BP (!) 113/52   Pulse 69   Temp 98.3 F (36.8 C)   Resp 18   Ht 5\' 7"  (1.702 m)   Wt 76.2 kg   SpO2 98%   BMI 26.31 kg/m  Physical Exam Vitals and nursing note reviewed.  Constitutional:      General: She is not in acute distress.    Appearance: She is not ill-appearing.  HENT:     Head: Normocephalic.  Eyes:     Pupils: Pupils are equal, round, and reactive to light.  Cardiovascular:     Rate and Rhythm: Normal rate and regular rhythm.     Pulses: Normal pulses.     Heart sounds: Normal heart sounds. No murmur heard.    No friction rub. No gallop.  Pulmonary:     Effort: Pulmonary effort is normal.     Breath sounds: Normal breath sounds.  Abdominal:     General: Abdomen is flat. There is no distension.     Palpations: Abdomen is soft.     Tenderness: There is no abdominal tenderness. There is no guarding or rebound.  Musculoskeletal:        General: Normal range of motion.     Cervical back: Neck supple.  Skin:    General: Skin is warm and dry.  Neurological:     General: No focal deficit present.     Mental Status: She is alert.  Psychiatric:  Mood and Affect: Mood normal.        Behavior: Behavior normal.     ED Results / Procedures / Treatments   Labs (all labs ordered are listed, but only abnormal results are displayed) Labs Reviewed  BASIC METABOLIC PANEL - Abnormal; Notable for the following components:      Result Value   Glucose, Bld 127 (*)    All other components within normal limits  CBC WITH DIFFERENTIAL/PLATELET  MAGNESIUM  TROPONIN I (HIGH SENSITIVITY)    EKG EKG Interpretation Date/Time:  Friday November 12 2022 11:44:03 EDT Ventricular Rate:  74 PR Interval:  236 QRS Duration:  99 QT Interval:  402 QTC Calculation: 428 R Axis:   -36  Text Interpretation: Sinus rhythm Multiple ventricular premature complexes Prolonged PR interval Left axis deviation Low voltage,  precordial leads Consider anterior infarct Artifact PVCs new, no other significant changes Confirmed by Alvira Monday ([id number]) on 11/12/2022 12:27:12 PM  Radiology DG Chest Portable 1 View  Result Date: 11/12/2022 CLINICAL DATA:  Ventricular tachycardia. EXAM: PORTABLE CHEST 1 VIEW COMPARISON:  X-ray 09/23/2009 FINDINGS: No consolidation, pneumothorax or effusion. No edema. Normal cardiopericardial silhouette. Overlapping cardiac leads. Degenerative changes of the spine. IMPRESSION: No acute cardiopulmonary disease Electronically Signed   By: Karen Kays M.D.   On: 11/12/2022 13:21    Procedures Procedures    Medications Ordered in ED Medications - No data to display  ED Course/ Medical Decision Making/ A&P                                 Medical Decision Making Amount and/or Complexity of Data Reviewed Independent Historian: caregiver    Details: Daughter provided history at bedside External Data Reviewed: notes.    Details: GI note from today, EKG from today Labs: ordered. Decision-making details documented in ED Course. Radiology: ordered and independent interpretation performed. Decision-making details documented in ED Course. ECG/medicine tests: ordered and independent interpretation performed. Decision-making details documented in ED Course.   This patient presents to the ED for concern of abnormal EKG, this involves an extensive number of treatment options, and is a complaint that carries with it a high risk of complications and morbidity.  The differential diagnosis includes arrhythmia, electrolyte abnormality, ACS, etc  67 year old female with history of diabetes and hyperlipidemia who presents to the ED from GI due to nonsustained V. Tach (3 runs).  No chest pain or shortness of breath.  Patient is followed by [facility] cardiology.  Reviewed EKGs from GI office prior to patient's scheduled endoscopy/colonoscopy.  EKG with numerous PVCs.  Had run of nonsustained V.  Tach, 3 PVCs. At that time patient asymptomatic. Denies any chest pain, shortness of breath, nausea, vomiting, diaphoresis.  Upon arrival patient afebrile, not tachycardic or hypoxic.  Patient in no acute distress.  Reassuring physical exam.  Routine labs ordered to rule out electrolyte abnormalities.  Troponin to rule out ACS.  EKG and chest x-ray ordered.  CBC unremarkable.  No leukocytosis.  Normal hemoglobin.  BMP reassuring.  Hyperglycemia 127.  No anion gap.  Normal renal function.  No major electrolyte derangements.  Normal magnesium and potassium.  Troponin normal.  EKG with normal sinus rhythm.  Multiple PVCs.  Chest x-ray personally reviewed and interpreted which is negative for any acute abnormalities.  Agree with radiology interpretation.  1:18 PM Discussed with Dr. Laurence Aly with cardiology at [facility] who notes given patient's labs are reassuring  and she has been asymptomatic this entire time, patient stable for discharge with close cardiology follow-up.   Patient maintained on cardiac monitoring during her entire ED stay which was over 2.5 hours with no further episodes of nonsustained V. tach.  Patient does have frequent PVCs on monitor upon review. Patient has remained asymptomatic her entire ED stay. Patient stable for discharge. Patient instructed to return to the ED if she develops any chest pain, shortness of breath, palpitations, or other concerning symptoms. Strict ED precautions discussed with patient. Patient states understanding and agrees to plan. Patient discharged home in no acute distress and stable vitals  Lives at home Has PCP Hx DM  Discussed with Dr. Dalene Seltzer who agrees with assessment and plan.       Final Clinical Impression(s) / ED Diagnoses Final diagnoses:  Abnormal EKG    Rx / DC Orders ED Discharge Orders     None         Jesusita Oka 11/12/22 1403    Alvira Monday, MD 11/12/22 2319

## 2022-11-12 NOTE — ED Notes (Addendum)
Call [facility] for consult [phone] spoke with Angeline @ 12:58pm Pt sees Ansel Bong PA consults go to [phone]

## 2022-11-12 NOTE — ED Triage Notes (Signed)
Arrived POV; stated she was getting ready for a routine colonoscopy and had some runs of Vtach. Patient stated no chest pain

## 2022-11-12 NOTE — Discharge Instructions (Signed)
It was a pleasure taking care of you today.  As discussed, I spoke to the cardiologist on-call for your cardiology office who notes you may follow-up with them in the outpatient setting.  Please return to the ER if you develop chest pain, shortness of breath, passing out spells, palpitations, or other concerning symptoms.  Please follow-up with cardiology on Monday for further evaluation.  Return to the ER for new or worsening symptoms.
